# Patient Record
Sex: Female | Born: 1935
Health system: Southern US, Community
[De-identification: ages and names within clinical notes are randomized; demographics above are authoritative.]

## PROBLEM LIST (undated history)

## (undated) DIAGNOSIS — K219 Gastro-esophageal reflux disease without esophagitis: Secondary | ICD-10-CM

## (undated) DIAGNOSIS — M797 Fibromyalgia: Secondary | ICD-10-CM

## (undated) DIAGNOSIS — I4891 Unspecified atrial fibrillation: Secondary | ICD-10-CM

## (undated) DIAGNOSIS — M1990 Unspecified osteoarthritis, unspecified site: Secondary | ICD-10-CM

## (undated) DIAGNOSIS — I10 Essential (primary) hypertension: Secondary | ICD-10-CM

## (undated) DIAGNOSIS — J449 Chronic obstructive pulmonary disease, unspecified: Secondary | ICD-10-CM

## (undated) HISTORY — PX: CHOLECYSTECTOMY: SHX55

## (undated) HISTORY — PX: ABDOMINAL HYSTERECTOMY: SHX81

## (undated) HISTORY — PX: OTHER SURGICAL HISTORY: SHX169

## (undated) HISTORY — PX: PACEMAKER IMPLANT: EP1218

---

## 2017-05-01 ENCOUNTER — Emergency Department (HOSPITAL_BASED_OUTPATIENT_CLINIC_OR_DEPARTMENT_OTHER)
Admission: EM | Admit: 2017-05-01 | Discharge: 2017-05-01 | Disposition: A | Discharge status: Home / Self Care | Payer: Medicare Other | Attending: Emergency Medicine | Admitting: Emergency Medicine

## 2017-05-01 ENCOUNTER — Emergency Department (HOSPITAL_BASED_OUTPATIENT_CLINIC_OR_DEPARTMENT_OTHER): Payer: Medicare Other

## 2017-05-01 ENCOUNTER — Encounter (HOSPITAL_BASED_OUTPATIENT_CLINIC_OR_DEPARTMENT_OTHER): Payer: Self-pay | Admitting: Emergency Medicine

## 2017-05-01 DIAGNOSIS — I10 Essential (primary) hypertension: Secondary | ICD-10-CM | POA: Insufficient documentation

## 2017-05-01 DIAGNOSIS — I4891 Unspecified atrial fibrillation: Secondary | ICD-10-CM | POA: Insufficient documentation

## 2017-05-01 DIAGNOSIS — R110 Nausea: Secondary | ICD-10-CM | POA: Diagnosis not present

## 2017-05-01 DIAGNOSIS — R5383 Other fatigue: Secondary | ICD-10-CM | POA: Insufficient documentation

## 2017-05-01 DIAGNOSIS — Z79899 Other long term (current) drug therapy: Secondary | ICD-10-CM | POA: Insufficient documentation

## 2017-05-01 DIAGNOSIS — R195 Other fecal abnormalities: Secondary | ICD-10-CM | POA: Diagnosis not present

## 2017-05-01 DIAGNOSIS — K5900 Constipation, unspecified: Secondary | ICD-10-CM | POA: Diagnosis not present

## 2017-05-01 HISTORY — DX: Fibromyalgia: M79.7

## 2017-05-01 HISTORY — DX: Unspecified atrial fibrillation: I48.91

## 2017-05-01 HISTORY — DX: Unspecified osteoarthritis, unspecified site: M19.90

## 2017-05-01 HISTORY — DX: Essential (primary) hypertension: I10

## 2017-05-01 HISTORY — DX: Gastro-esophageal reflux disease without esophagitis: K21.9

## 2017-05-01 HISTORY — DX: Chronic obstructive pulmonary disease, unspecified: J44.9

## 2017-05-01 LAB — COMPREHENSIVE METABOLIC PANEL
ALT: 18 U/L (ref 14–54)
AST: 24 U/L (ref 15–41)
Albumin: 3.2 g/dL — ABNORMAL LOW (ref 3.5–5.0)
Alkaline Phosphatase: 55 U/L (ref 38–126)
Anion gap: 6 (ref 5–15)
BILIRUBIN TOTAL: 0.5 mg/dL (ref 0.3–1.2)
BUN: 14 mg/dL (ref 6–20)
CO2: 25 mmol/L (ref 22–32)
Calcium: 8.9 mg/dL (ref 8.9–10.3)
Chloride: 106 mmol/L (ref 101–111)
Creatinine, Ser: 0.86 mg/dL (ref 0.44–1.00)
GFR calc Af Amer: >60 mL/min (ref >60)
Glucose, Bld: 106 mg/dL — ABNORMAL HIGH (ref 65–99)
POTASSIUM: 4.5 mmol/L (ref 3.5–5.1)
Sodium: 137 mmol/L (ref 135–145)
TOTAL PROTEIN: 7 g/dL (ref 6.5–8.1)

## 2017-05-01 LAB — CBC WITH DIFFERENTIAL/PLATELET
BASOS ABS: 0 10*3/uL (ref 0.0–0.1)
Basophils Relative: 0 %
EOS ABS: 0.1 10*3/uL (ref 0.0–0.7)
Eosinophils Relative: 1 %
HCT: 32 % — ABNORMAL LOW (ref 36.0–46.0)
HEMOGLOBIN: 10.8 g/dL — AB (ref 12.0–15.0)
LYMPHS ABS: 2.8 10*3/uL (ref 0.7–4.0)
Lymphocytes Relative: 35 %
MCH: 31.3 pg (ref 26.0–34.0)
MCHC: 33.8 g/dL (ref 30.0–36.0)
MCV: 92.8 fL (ref 78.0–100.0)
Monocytes Absolute: 0.7 10*3/uL (ref 0.1–1.0)
Monocytes Relative: 9 %
NEUTROS ABS: 4.4 10*3/uL (ref 1.7–7.7)
Neutrophils Relative %: 55 %
Platelets: 317 10*3/uL (ref 150–400)
RBC: 3.45 MIL/uL — AB (ref 3.87–5.11)
RDW: 13.5 % (ref 11.5–15.5)
WBC: 8 10*3/uL (ref 4.0–10.5)

## 2017-05-01 LAB — OCCULT BLOOD X 1 CARD TO LAB, STOOL: FECAL OCCULT BLD: NEGATIVE

## 2017-05-01 MED ORDER — IOPAMIDOL (ISOVUE-300) INJECTION 61%
100.0000 mL | Freq: Once | INTRAVENOUS | Status: AC | PRN
  Administered 2017-05-01: 100 mL via INTRAVENOUS

## 2017-05-01 MED ORDER — METOCLOPRAMIDE HCL 10 MG PO TABS
10.0000 mg | ORAL_TABLET | Freq: Once | ORAL | Status: AC
  Administered 2017-05-01: 10 mg via ORAL
  Filled 2017-05-01: qty 1

## 2017-05-01 MED ORDER — POLYETHYLENE GLYCOL 3350 17 GM/SCOOP PO POWD: 1.0000 | Freq: Once | ORAL | 0 refills | Status: AC

## 2017-05-01 MED FILL — POLYETHYLENE GLYCOL 3350: 15 days supply | Qty: 255 | Fill #0

## 2017-05-01 NOTE — ED Triage Notes (Signed)
Patient states that she had had 5 months of continuous nausea - reports that she has been to multiple ER's for this, and multiple Dr's for the same. Reports that nothing has helped. The patient reports that she does not vomit. She reports that it is worse in the AM and better 2 - 3 hours after eating.

## 2017-05-01 NOTE — Discharge Instructions (Signed)
Please see the information and instructions below regarding your visit.  Your diagnoses today include:  1. Nausea without vomiting    Your exam and testing is reassuring today. Your CT report showed that you has some diverticulosis (pouches in colon) but no inflammation of these pouches. It also noted a small hiatal hernia which is at the junction of the stomach and esophagus. This is very small and is often noted on endoscopy.  Tests performed today include: See side panel of your discharge paperwork for testing performed today. Vital signs are listed at the bottom of these instructions.  -CT scan -Blood counts. Your hemoglobin is slightly lower than it was in July (12.2 to 10.8 today). Your testing of occult blood in your stool was negative however. -Electrolytes -Testing for occult blood in the stool  Medications prescribed:    Take any prescribed medications only as prescribed, and any over the counter medications only as directed on the packaging.  Please begin taking MiraLAX for soft bowel movements.  Home care instructions:  Please follow any educational materials contained in this packet.   Lease continue to eat small meals several times a day for nutrition.  Follow-up instructions: Please follow-up with your primary care provider as needed for further evaluation of your symptoms if they are not completely improved or if needed [facility]  Please [facility] with [facility] as soon as possible for evaluation.  Return instructions:  Please return to the Emergency Department if you experience worsening symptoms.  Please return to the emergency department for any fever or chills in the setting of your nausea, any bright red blood or dark tarry stools, nausea with vomiting that causes you to not be able to eat, or focal tenderness in your belly. Please return if you have any other emergent concerns.  Additional Information:   Your vital signs  today were: BP 127/62    Pulse 60    Temp 98.5 F (36.9 C) (Oral)    Resp 12    Ht 5\' 5"  (1.651 m)    Wt 66.2 kg (146 lb)    SpO2 97%    BMI 24.30 kg/m  If your blood pressure (BP) was elevated on multiple readings during this visit above 130 for the top number or above 80 for the bottom number, please have this repeated by your primary care provider within one month. --------------  Thank you for allowing us to participate in your care today.

## 2017-05-01 NOTE — ED Provider Notes (Signed)
[facility] EMERGENCY DEPARTMENT Provider Note   CSN: [id number] Arrival date & time: 05/01/17  1012     History   Chief Complaint Chief Complaint  Patient presents with  . Nausea    HPI Deanna Raymond is a 83 y.o. female.  HPI  Patient is an 83 year old female with a history of atrial fibrillation (on Eliquis), SSS (paced; no defibrillator) presenting for 5 months of daily nausea and generalized fatigue. Patient reports that she went on a cruise in May 2018, and subsequent developed upper respiratory infection with generalized body aches and weakness afterwards. Patient went through a course of doxycycline as well as treatment for strep throat in an attempt to relieve symptoms without relief. Patient has seen multiple specialists inlcuding gastroenterology [facility]) who completed upper and lower endoscopy without abnormal findings, as well mesenteric arterial duplex. Patient subsequently saw neurology due to concerns for mass lesion of the brain which was negative on CT, and vascular surgery [facility]) for concerns for mesenteric ischemia, which was determined not the source of her symptoms as mesenteric arterial duplex was a false reading. Patient denies any fever or chills with her symptoms, dysphasia, regurgitation, vomiting, diarrhea. Patient does report that she is chronically constipated and noticed 2 days ago she had some clots of blood in her stool which is abnormal for her. Patient has no history of hemorrhoids to her knowledge. Patient reports she does get abdominal cramping prior to having a bowel movement, however this is chronic for her prior to the onset of her symptoms. Patient reports that she has lost between 5 and 8 pounds due to the symptoms. Patient is denying dysuria, flank pain. Patient presented her dizziness is primarily upon standing, and she often has to take breaks in between doing regular activities of daily living and chores. Abdominal surgeries  include cholecystectomy and hysterectomy.  Past Medical History:  Diagnosis Date  . A-fib (HCC)   . Arthritis   . COPD (chronic obstructive pulmonary disease) (HCC)   . Fibromyalgia   . GERD (gastroesophageal reflux disease)   . Hypertension     There are no active problems to display for this patient.   Past Surgical History:  Procedure Laterality Date  . ABDOMINAL HYSTERECTOMY    . bladder mesh    . CHOLECYSTECTOMY    . PACEMAKER IMPLANT      OB History    No data available       Home Medications    Prior to Admission medications   Medication Sig Start Date End Date Taking? Authorizing Provider  apixaban (ELIQUIS) 2.5 MG TABS tablet Take 2.5 mg by mouth 2 (two) times daily.   Yes [provider]  aspirin EC 81 MG tablet Take 81 mg by mouth daily.   Yes [provider]  furosemide (LASIX) 20 MG tablet Take 20 mg by mouth as needed.   Yes [provider]  losartan (COZAAR) 25 MG tablet Take 25 mg by mouth daily.   Yes [provider]  metoprolol tartrate (LOPRESSOR) 25 MG tablet Take 25 mg by mouth 2 (two) times daily.   Yes [provider]  OLANZapine (ZYPREXA) 2.5 MG tablet Take 2.5 mg by mouth at bedtime.   Yes [provider]  pantoprazole (PROTONIX) 40 MG tablet Take 40 mg by mouth daily.   Yes [provider]    Family History History reviewed. No pertinent family history.  Social History Social History  Substance Use Topics  . Smoking  status: Never Smoker  . Smokeless tobacco: Never Used  . Alcohol use No     Allergies   Patient has no known allergies.   Review of Systems Review of Systems  Constitutional: Positive for appetite change, fatigue and unexpected weight change. Negative for chills and fever.  HENT: Negative for congestion, rhinorrhea, sinus pain, sore throat and trouble swallowing.   Eyes: Negative for visual disturbance.  Respiratory: Negative for cough, chest tightness  and shortness of breath.   Cardiovascular: Negative for chest pain and leg swelling.  Gastrointestinal: Positive for blood in stool, constipation and nausea. Negative for abdominal pain, diarrhea and vomiting.  Genitourinary: Negative for dysuria and flank pain.  Musculoskeletal: Positive for back pain. Negative for myalgias.  Skin: Negative for rash.  Neurological: Positive for weakness, light-headedness and headaches. Negative for dizziness and syncope.     Physical Exam Updated Vital Signs BP (!) 150/90 (BP Location: Left Arm)   Pulse 84   Temp 98.5 F (36.9 C) (Oral)   Resp 18   Ht 5\' 5"  (1.651 m)   Wt 66.2 kg (146 lb)   SpO2 98%   BMI 24.30 kg/m   Physical Exam  Constitutional: She appears well-developed and well-nourished. No distress.  HENT:  Head: Normocephalic and atraumatic.  Mouth/Throat: Oropharynx is clear and moist.  Eyes: Pupils are equal, round, and reactive to light. Conjunctivae and EOM are normal.  Neck: Normal range of motion. Neck supple.  Cardiovascular: Normal rate, regular rhythm, S1 normal and S2 normal.   No murmur heard. Patient is paced. Bradycardia noted.   Pulmonary/Chest: Effort normal and breath sounds normal. She has no wheezes. She has no rales.  Abdominal: Soft. She exhibits no distension. There is tenderness. There is no guarding.  Patient is tender to palpation in the left lower quadrant. Discomfort to palpation epigastrium.  Genitourinary: Rectal exam shows guaiac negative stool.  Genitourinary Comments: Exam performed with nurse chaperone present. No external hemorrhoids noted. No fissures or lesions. No masses of rectum. Small amount of brown hard stool present in rectal vault. No enlarged internal hemorrhoids palpated.  Musculoskeletal: Normal range of motion. She exhibits no edema or deformity.  Lymphadenopathy:    She has no cervical adenopathy.  Neurological: She is alert.  Cranial nerves grossly intact. Patient was extremities  symmetrically with good and symmetrically.  Skin: Skin is warm and dry. No rash noted. No erythema.  Psychiatric: She has a normal mood and affect. Her behavior is normal. Judgment and thought content normal.  Nursing note and vitals reviewed.    ED Treatments / Results  Labs (all labs ordered are listed, but only abnormal results are displayed) Labs Reviewed  COMPREHENSIVE METABOLIC PANEL  CBC WITH DIFFERENTIAL/PLATELET    EKG  EKG Interpretation None       Radiology No results found.  Procedures Procedures (including critical care time)  Medications Ordered in ED Medications  metoCLOPramide (REGLAN) tablet 10 mg (not administered)     Initial Impression / Assessment and Plan / ED Course  I have reviewed the triage vital signs and the nursing notes.  Pertinent labs & imaging results that were available during my care of the patient were reviewed by me and considered in my medical decision making (see chart for details).  Clinical Course as of May 01 1820  Thu May 01, 2017  1607 I-stat Chem 8, ED (when main lab is down) [AM]    Clinical Course User Index [AM] Elisha Ponder, [place]  Final Clinical Impressions(s) / ED Diagnoses   Final diagnoses:  None   MDM  Differential diagnosis includes diverticulitis superimposed with nausea symptoms, gastroparesis, gastritis, severe constipation. Due to focal tenderness in the left lower quadrant, the patient to receive CT of abdomen and pelvis with contrast. This has not been performed yet in patient's workup. This was ordered in consultation with Dr. Drema PryPedro Cardama. Pending EKG, will prescribe Reglan for nausea control. Patient and her daughter were in misunderstanding that this is an emergency department and believed this facility to be a walk-in clinic. Patient and daughter consented to continue with evaluation of any further studies, as well as receive referral to a specialist, as they are looking for a new  gastroenterologist.  EKG demonstrates atrial-ventricular dual paced rhythm. QTc prolonged d/t widened QRS.   On reevaluation, patient received significant improvement with PO Reglan.  CT abdomen and pelvis demonstrated diverticulosis without diverticulitis.There was a small hiatal hernia, but no evidence of obstruction in stomach or intestines. Laboratory studies demonstrated normocytic anemia at 10.8. Prior labs from July 2018 demonstrated hemoglobin of 12.2. Patient was Hemoccult-negative today, and no evidence of masses or hemorrhoids palpated in the rectum. Do not suspect acute process at this time. Discussed etiologies of nausea with patient and provided reassurance, as patient and daughter were concerned about abdominal masses. Patient may be experiencing gastroparesis. Patient reports that she has been taking antacids and will continue to. Patient instructed that, as she does not have a defibrillator, and she has multiple cardiac risk factors, she will need to see gastroenterology before any antiemetics are prescribed. This was discussed with patient by Dr. Drema PryPedro Cardama. Patient deemed stable for discharge with gastroenterology follow-up. Patient given return precautions for any emesis with nausea prevents her from keeping down by mouth intake, hematemesis, bright red blood per rectum and large quantities, melena, focal abdominal tenderness, or any fever or chills of her symptoms. Patient and family are in agreement with plan of care.  This is a shared visit with Dr. Drema PryPedro Cardama. Patient was independently evaluated by this attending physician. Attending physician consulted in evaluation and discharge management.  New Prescription New Prescriptions   No medications on file       Delia ChimesMurray, Krista Som B, PA-C 05/01/17 1840    Nira Connardama, Pedro Eduardo, MD 05/04/17 [id number]

## 2017-05-01 NOTE — ED Notes (Signed)
ED Provider at bedside. 

## 2019-01-18 IMAGING — CT CT ABD-PELV W/ CM
2 of 5 series · 17 of 46 positions shown, 19 images · IV contrast (APPLIED)
Comparison: None.

CLINICAL DATA: Abdominal pain and nausea for 5 months.

EXAM:
CT ABDOMEN AND PELVIS WITH CONTRAST
TECHNIQUE: Multidetector CT imaging of the abdomen and pelvis was performed
using the standard protocol following bolus administration of
intravenous contrast.
CONTRAST:  100mL [id number] IOPAMIDOL (MCUJ41-NBB) INJECTION 61%

[Series 2: axial st · axial · 0.78mm/px · z∈[-539,-114]mm · 14 of 95 slices shown, 16 images]
[im 5/95  soft-tissue]
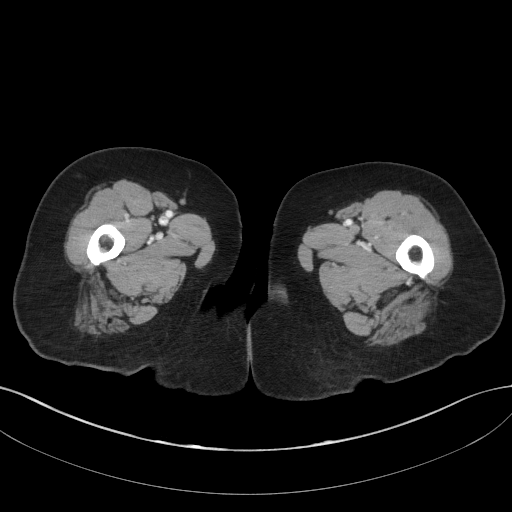
[im 5/95  bone]
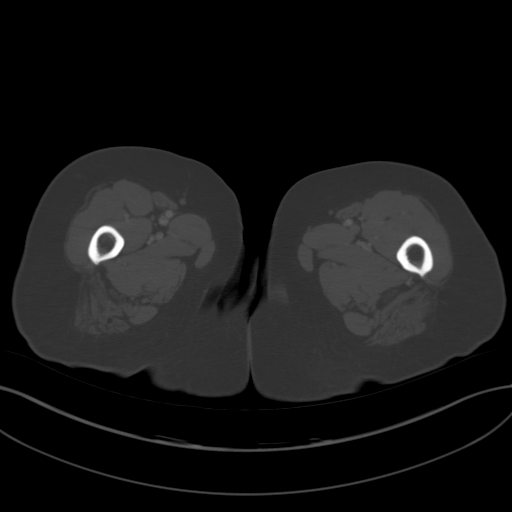
[im 15/95  soft-tissue]
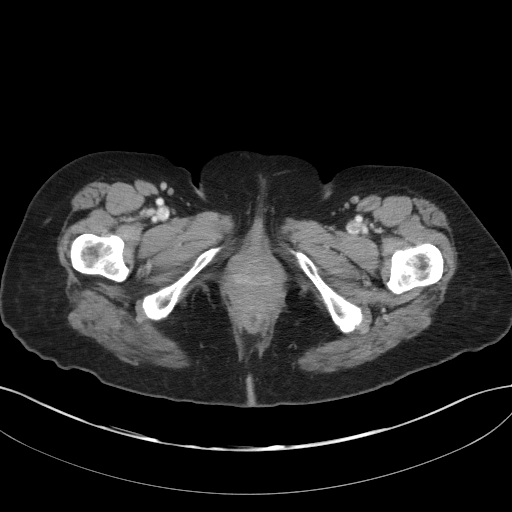
[im 19/95  soft-tissue]
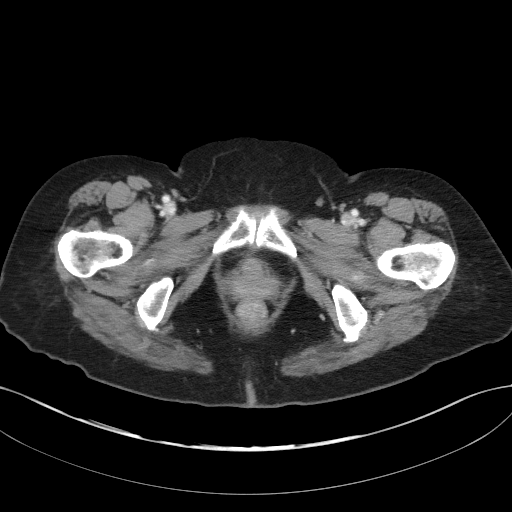
[im 24/95  soft-tissue]
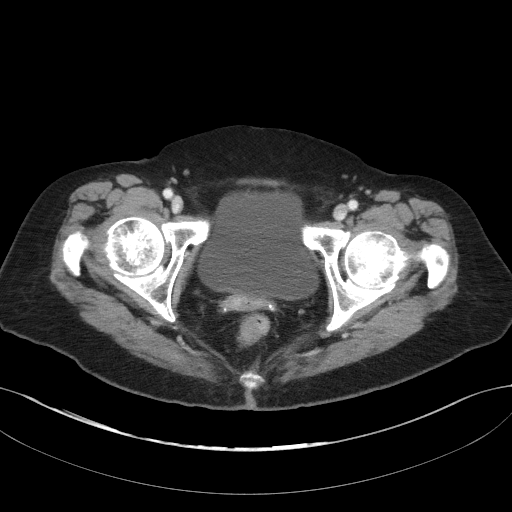
[im 33/95  soft-tissue]
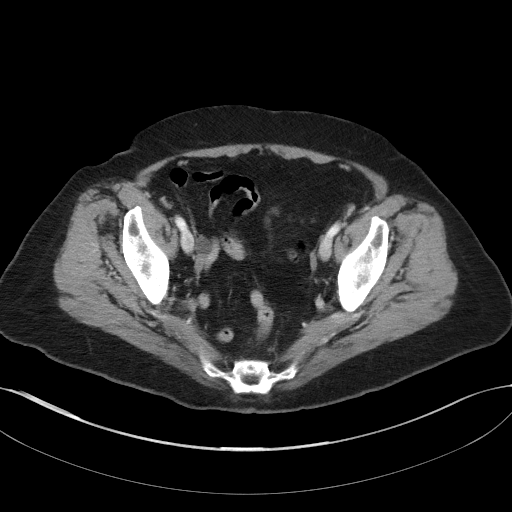
[im 38/95  soft-tissue]
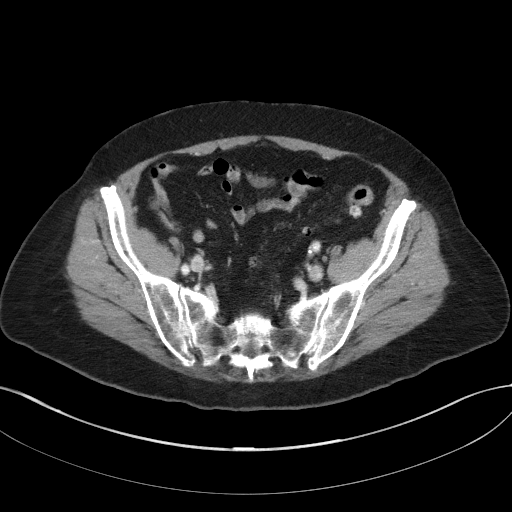
[im 43/95  soft-tissue]
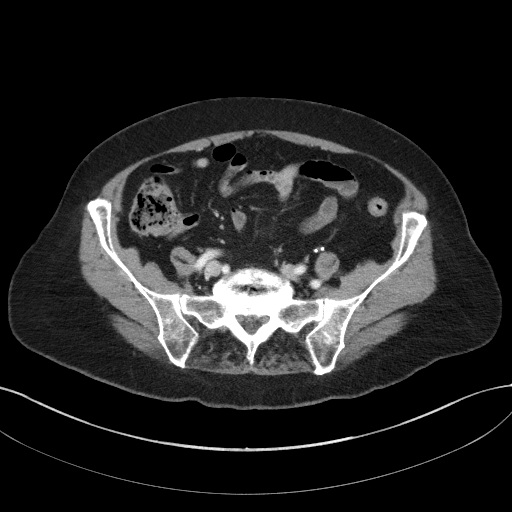
[im 52/95  soft-tissue]
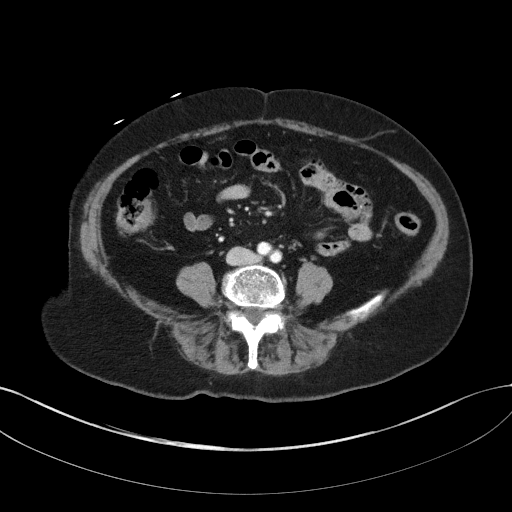
[im 57/95  soft-tissue]
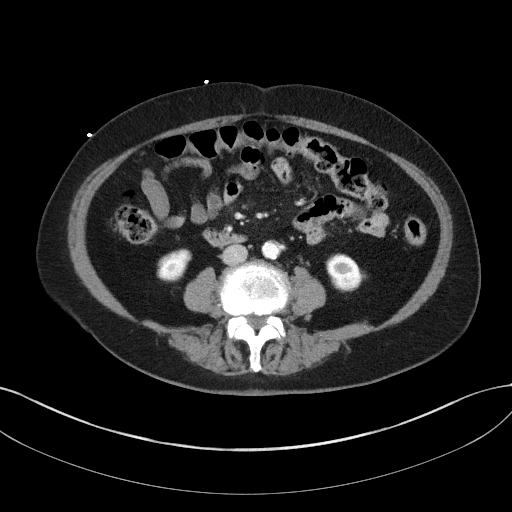
[im 57/95  bone]
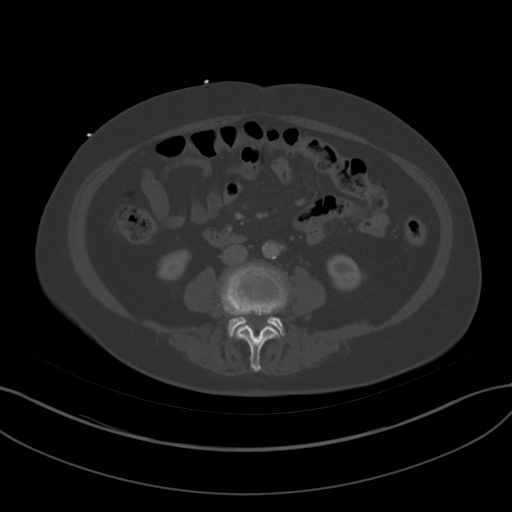
[im 62/95  soft-tissue]
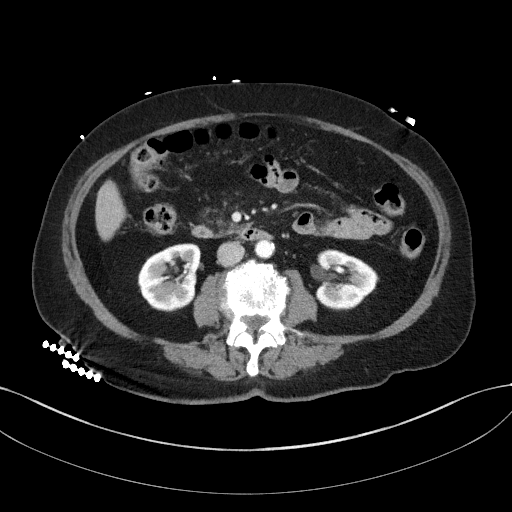
[im 71/95  soft-tissue]
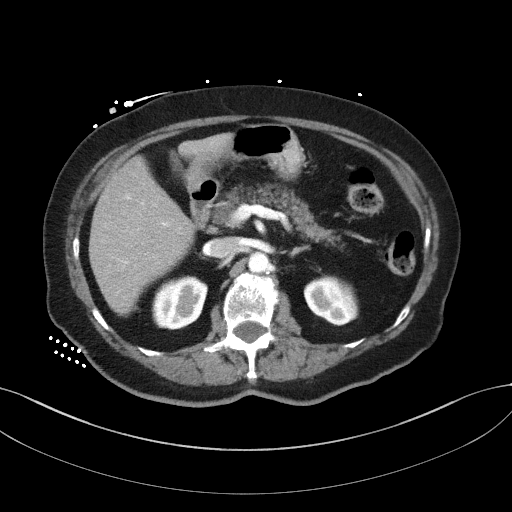
[im 76/95  soft-tissue]
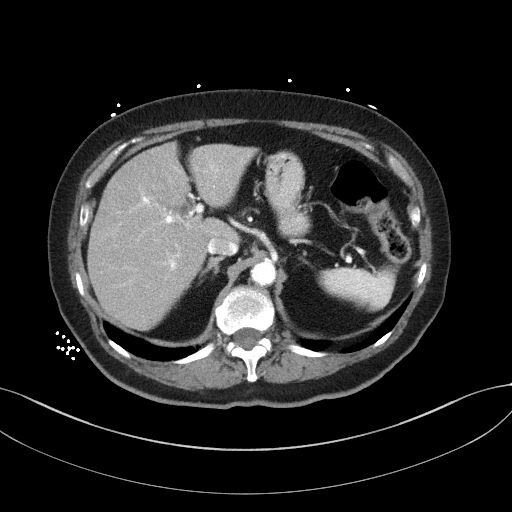
[im 80/95  soft-tissue]
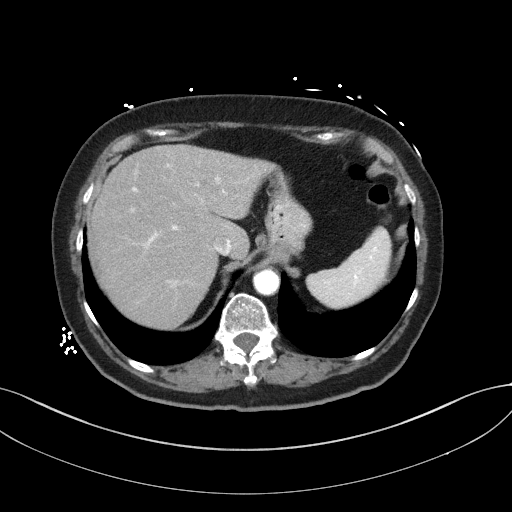
[im 90/95  soft-tissue]
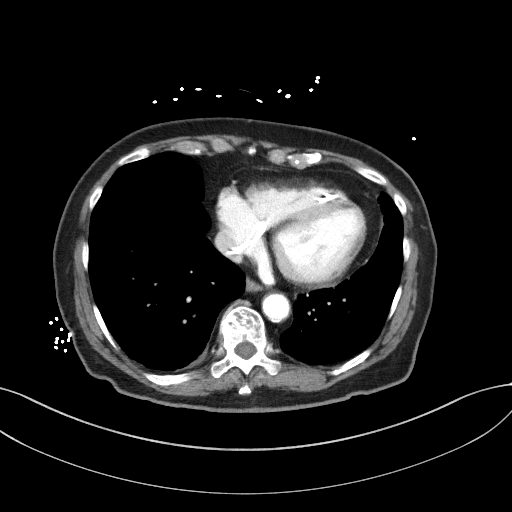

[Series 4: coronal st · coronal · 0.89mm/px · 3 of 85 slices shown]
[im 29/85  soft-tissue]
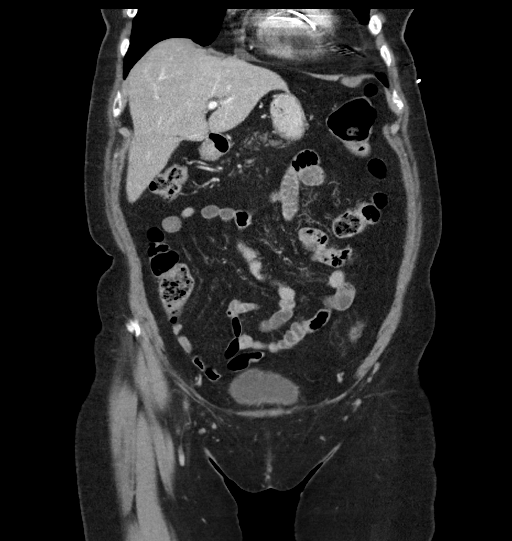
[im 38/85  soft-tissue]
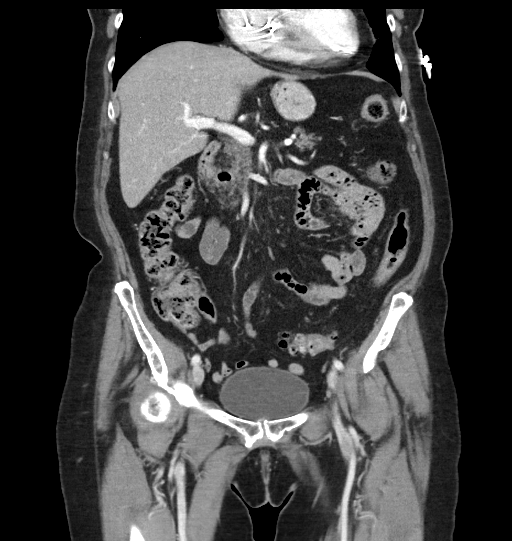
[im 47/85  soft-tissue]
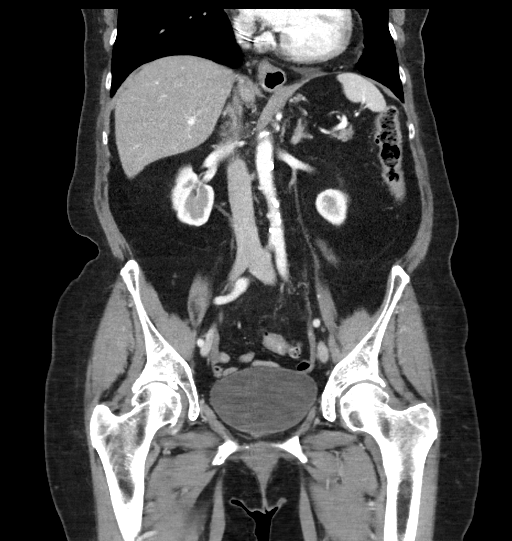

[17 of 46 positions shown; findings below may reference images not displayed]

FINDINGS: Lower Chest: No acute findings.

Hepatobiliary: No hepatic masses identified. Prior cholecystectomy.
No evidence of biliary obstruction.

Pancreas:  No mass or inflammatory changes.

Spleen: Within normal limits in size and appearance.

Adrenals/Urinary Tract: No masses identified. Small cyst noted in
upper pole of right kidney. No evidence of hydronephrosis.

Stomach/Bowel: Small hiatal hernia. No evidence of obstruction,
inflammatory process or abnormal fluid collections. Diverticulosis
of sigmoid colon seen, however there is no evidence of acute
diverticulitis.

Vascular/Lymphatic: No pathologically enlarged lymph nodes. No
abdominal aortic aneurysm. Aortic atherosclerosis.

Reproductive: Prior hysterectomy noted. Adnexal regions are
unremarkable in appearance.

Other:  None.

Musculoskeletal:  No suspicious bone lesions identified.
IMPRESSION: Colonic diverticulosis, without radiographic evidence of
diverticulitis or other acute findings.

Small hiatal hernia.

Aortic atherosclerosis.
